# Patient Record
Sex: Male | Born: 1990 | Race: White | Hispanic: No | Marital: Single | Smoking: Current every day smoker
Health system: Southern US, Community
[De-identification: ages and names within clinical notes are randomized; demographics above are authoritative.]

---

## 2016-11-16 ENCOUNTER — Emergency Department (HOSPITAL_COMMUNITY): Payer: Self-pay

## 2016-11-16 ENCOUNTER — Emergency Department (HOSPITAL_COMMUNITY)
Admission: EM | Admit: 2016-11-16 | Discharge: 2016-11-16 | Disposition: A | Discharge status: Home / Self Care | Payer: Self-pay | Attending: Emergency Medicine | Admitting: Emergency Medicine

## 2016-11-16 ENCOUNTER — Encounter (HOSPITAL_COMMUNITY): Payer: Self-pay | Admitting: Emergency Medicine

## 2016-11-16 DIAGNOSIS — Y939 Activity, unspecified: Secondary | ICD-10-CM | POA: Insufficient documentation

## 2016-11-16 DIAGNOSIS — F17200 Nicotine dependence, unspecified, uncomplicated: Secondary | ICD-10-CM | POA: Insufficient documentation

## 2016-11-16 DIAGNOSIS — S0181XA Laceration without foreign body of other part of head, initial encounter: Secondary | ICD-10-CM | POA: Insufficient documentation

## 2016-11-16 DIAGNOSIS — Y929 Unspecified place or not applicable: Secondary | ICD-10-CM | POA: Insufficient documentation

## 2016-11-16 DIAGNOSIS — S0990XA Unspecified injury of head, initial encounter: Secondary | ICD-10-CM

## 2016-11-16 DIAGNOSIS — S0101XA Laceration without foreign body of scalp, initial encounter: Secondary | ICD-10-CM | POA: Insufficient documentation

## 2016-11-16 DIAGNOSIS — S3992XA Unspecified injury of lower back, initial encounter: Secondary | ICD-10-CM

## 2016-11-16 DIAGNOSIS — S61412A Laceration without foreign body of left hand, initial encounter: Secondary | ICD-10-CM | POA: Insufficient documentation

## 2016-11-16 DIAGNOSIS — Y998 Other external cause status: Secondary | ICD-10-CM | POA: Insufficient documentation

## 2016-11-16 LAB — CBC WITH DIFFERENTIAL/PLATELET
Basophils Absolute: 0 10*3/uL (ref 0.0–0.1)
Basophils Relative: 0 %
Eosinophils Absolute: 0 10*3/uL (ref 0.0–0.7)
Eosinophils Relative: 0 %
HEMATOCRIT: 38.8 % — AB (ref 39.0–52.0)
HEMOGLOBIN: 14.2 g/dL (ref 13.0–17.0)
LYMPHS ABS: 1.2 10*3/uL (ref 0.7–4.0)
Lymphocytes Relative: 5 %
MCH: 32.1 pg (ref 26.0–34.0)
MCHC: 36.6 g/dL — AB (ref 30.0–36.0)
MCV: 87.8 fL (ref 78.0–100.0)
MONO ABS: 1.6 10*3/uL — AB (ref 0.1–1.0)
MONOS PCT: 8 %
NEUTROS ABS: 18.8 10*3/uL — AB (ref 1.7–7.7)
NEUTROS PCT: 87 %
Platelets: 283 10*3/uL (ref 150–400)
RBC: 4.42 MIL/uL (ref 4.22–5.81)
RDW: 12 % (ref 11.5–15.5)
WBC: 21.7 10*3/uL — ABNORMAL HIGH (ref 4.0–10.5)

## 2016-11-16 LAB — COMPREHENSIVE METABOLIC PANEL
ALK PHOS: 48 U/L (ref 38–126)
ALT: 16 U/L — ABNORMAL LOW (ref 17–63)
ANION GAP: 7 (ref 5–15)
AST: 23 U/L (ref 15–41)
Albumin: 4.2 g/dL (ref 3.5–5.0)
BILIRUBIN TOTAL: 0.8 mg/dL (ref 0.3–1.2)
BUN: 18 mg/dL (ref 6–20)
CALCIUM: 8.8 mg/dL — AB (ref 8.9–10.3)
CO2: 28 mmol/L (ref 22–32)
Chloride: 103 mmol/L (ref 101–111)
Creatinine, Ser: 1.21 mg/dL (ref 0.61–1.24)
GLUCOSE: 103 mg/dL — AB (ref 65–99)
POTASSIUM: 3.3 mmol/L — AB (ref 3.5–5.1)
Sodium: 138 mmol/L (ref 135–145)
TOTAL PROTEIN: 6 g/dL — AB (ref 6.5–8.1)

## 2016-11-16 LAB — ETHANOL

## 2016-11-16 MED ORDER — LIDOCAINE-EPINEPHRINE (PF) 2 %-1:200000 IJ SOLN
20.0000 mL | Freq: Once | INTRAMUSCULAR | Status: AC
  Administered 2016-11-16: 20 mL
  Filled 2016-11-16: qty 20

## 2016-11-16 MED ORDER — HYDROCODONE-ACETAMINOPHEN 5-325 MG PO TABS
1.0000 | ORAL_TABLET | Freq: Once | ORAL | Status: AC
  Administered 2016-11-16: 1 via ORAL
  Filled 2016-11-16: qty 1

## 2016-11-16 MED ORDER — METHOCARBAMOL 500 MG PO TABS
500.0000 mg | ORAL_TABLET | Freq: Once | ORAL | Status: AC
  Administered 2016-11-16: 500 mg via ORAL
  Filled 2016-11-16: qty 1

## 2016-11-16 MED ORDER — IBUPROFEN 600 MG PO TABS: 600.0000 mg | ORAL_TABLET | Freq: Four times a day (QID) | ORAL | 0 refills | Status: AC | PRN

## 2016-11-16 MED ORDER — METHOCARBAMOL 500 MG PO TABS: 1000.0000 mg | ORAL_TABLET | Freq: Three times a day (TID) | ORAL | 0 refills | Status: AC | PRN

## 2016-11-16 MED ORDER — HYDROCODONE-ACETAMINOPHEN 5-325 MG PO TABS: 1.0000 | ORAL_TABLET | Freq: Four times a day (QID) | ORAL | 0 refills | Status: AC | PRN

## 2016-11-16 NOTE — ED Provider Notes (Signed)
Patient signed out to me from Dr. Ranae Palms for laceration repair. Please see previous notes for further history.  Laceration repair performed by L. Harbrecht, MD, and evaluated by attending, Dr. Eudelia Bunch. Patient appears safe for discharge. Suture aftercare instructions given. Return precautions given. Patient states he understands and agrees to plan.   Alveria Apley, PA-C 11/17/16 1732    Nira Conn, MD 11/21/16 [phone]
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
## 2016-11-16 NOTE — Discharge Instructions (Addendum)
Return to the emergency room if you develop nausea, vision changes, confusion, or any new concerning signs.  1. Medications: Tylenol or ibuprofen for pain, norco for severe pain. 2. Treatment: ice for swelling, keep wound clean with warm soap and water and keep bandage dry, do not submerge in water for 24 hours 3. Follow Up: Please return in 7 days to have your stitches removed or sooner if you have concerns. Return to the emergency department for increased redness, drainage of pus from the wound  WOUND CARE  Keep area clean and dry for 24 hours. Do not remove bandage until after 24 hours.  After 24 hours, remove bandage and wash wound gently with mild soap and warm water. Reapply a new bandage after cleaning wound, if directed.   Continue daily cleansing with soap and water until stitches are removed.  Do not apply any ointments or creams to the wound while stitches are in place, as this may cause delayed healing. Return if you experience any of the following signs of infection: Swelling, redness, pus drainage, streaking, fever >101.0 F  Return if you experience excessive bleeding that does not stop after 15-20 minutes of constant, firm pressure.
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
## 2016-11-16 NOTE — ED Notes (Signed)
To XRAY at this time 
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
